# Patient Record
Sex: Male | Born: 1955 | Race: Black or African American | Hispanic: No | Marital: Married | Smoking: Former smoker
Health system: Southern US, Community
[De-identification: ages and names within clinical notes are randomized; demographics above are authoritative.]

## PROBLEM LIST (undated history)

## (undated) DIAGNOSIS — H409 Unspecified glaucoma: Secondary | ICD-10-CM

## (undated) HISTORY — DX: Unspecified glaucoma: H40.9

---

## 1998-02-15 ENCOUNTER — Ambulatory Visit (HOSPITAL_COMMUNITY): Admission: RE | Admit: 1998-02-15 | Discharge: 1998-02-15 | Payer: Self-pay | Admitting: Cardiology

## 1999-07-03 ENCOUNTER — Encounter: Payer: Self-pay | Admitting: Family Medicine

## 1999-07-03 ENCOUNTER — Ambulatory Visit (HOSPITAL_COMMUNITY): Admission: RE | Admit: 1999-07-03 | Discharge: 1999-07-03 | Payer: Self-pay | Admitting: Family Medicine

## 1999-08-05 ENCOUNTER — Encounter: Payer: Self-pay | Admitting: Orthopedic Surgery

## 1999-08-05 ENCOUNTER — Ambulatory Visit (HOSPITAL_COMMUNITY): Admission: RE | Admit: 1999-08-05 | Discharge: 1999-08-05 | Payer: Self-pay | Admitting: Orthopedic Surgery

## 1999-10-17 ENCOUNTER — Encounter: Payer: Self-pay | Admitting: Neurosurgery

## 1999-10-19 ENCOUNTER — Ambulatory Visit (HOSPITAL_COMMUNITY): Admission: RE | Admit: 1999-10-19 | Discharge: 1999-10-19 | Payer: Self-pay | Admitting: Neurosurgery

## 1999-10-19 ENCOUNTER — Encounter: Payer: Self-pay | Admitting: Neurosurgery

## 2002-06-12 HISTORY — PX: SPINE SURGERY: SHX786

## 2007-02-07 ENCOUNTER — Emergency Department (HOSPITAL_COMMUNITY): Admission: EM | Admit: 2007-02-07 | Discharge: 2007-02-07 | Payer: Self-pay | Admitting: Emergency Medicine

## 2007-03-28 ENCOUNTER — Ambulatory Visit (HOSPITAL_COMMUNITY): Admission: RE | Admit: 2007-03-28 | Discharge: 2007-03-29 | Payer: Self-pay | Admitting: Specialist

## 2008-08-04 IMAGING — CR DG PELVIS 1-2V
1 series · 1 of 1 positions shown · non-contrast
Comparison: none

CLINICAL DATA: 50-year-old male with back pain. 
 PELVIS ? 1 VIEW:

[t pelvis a.p.]
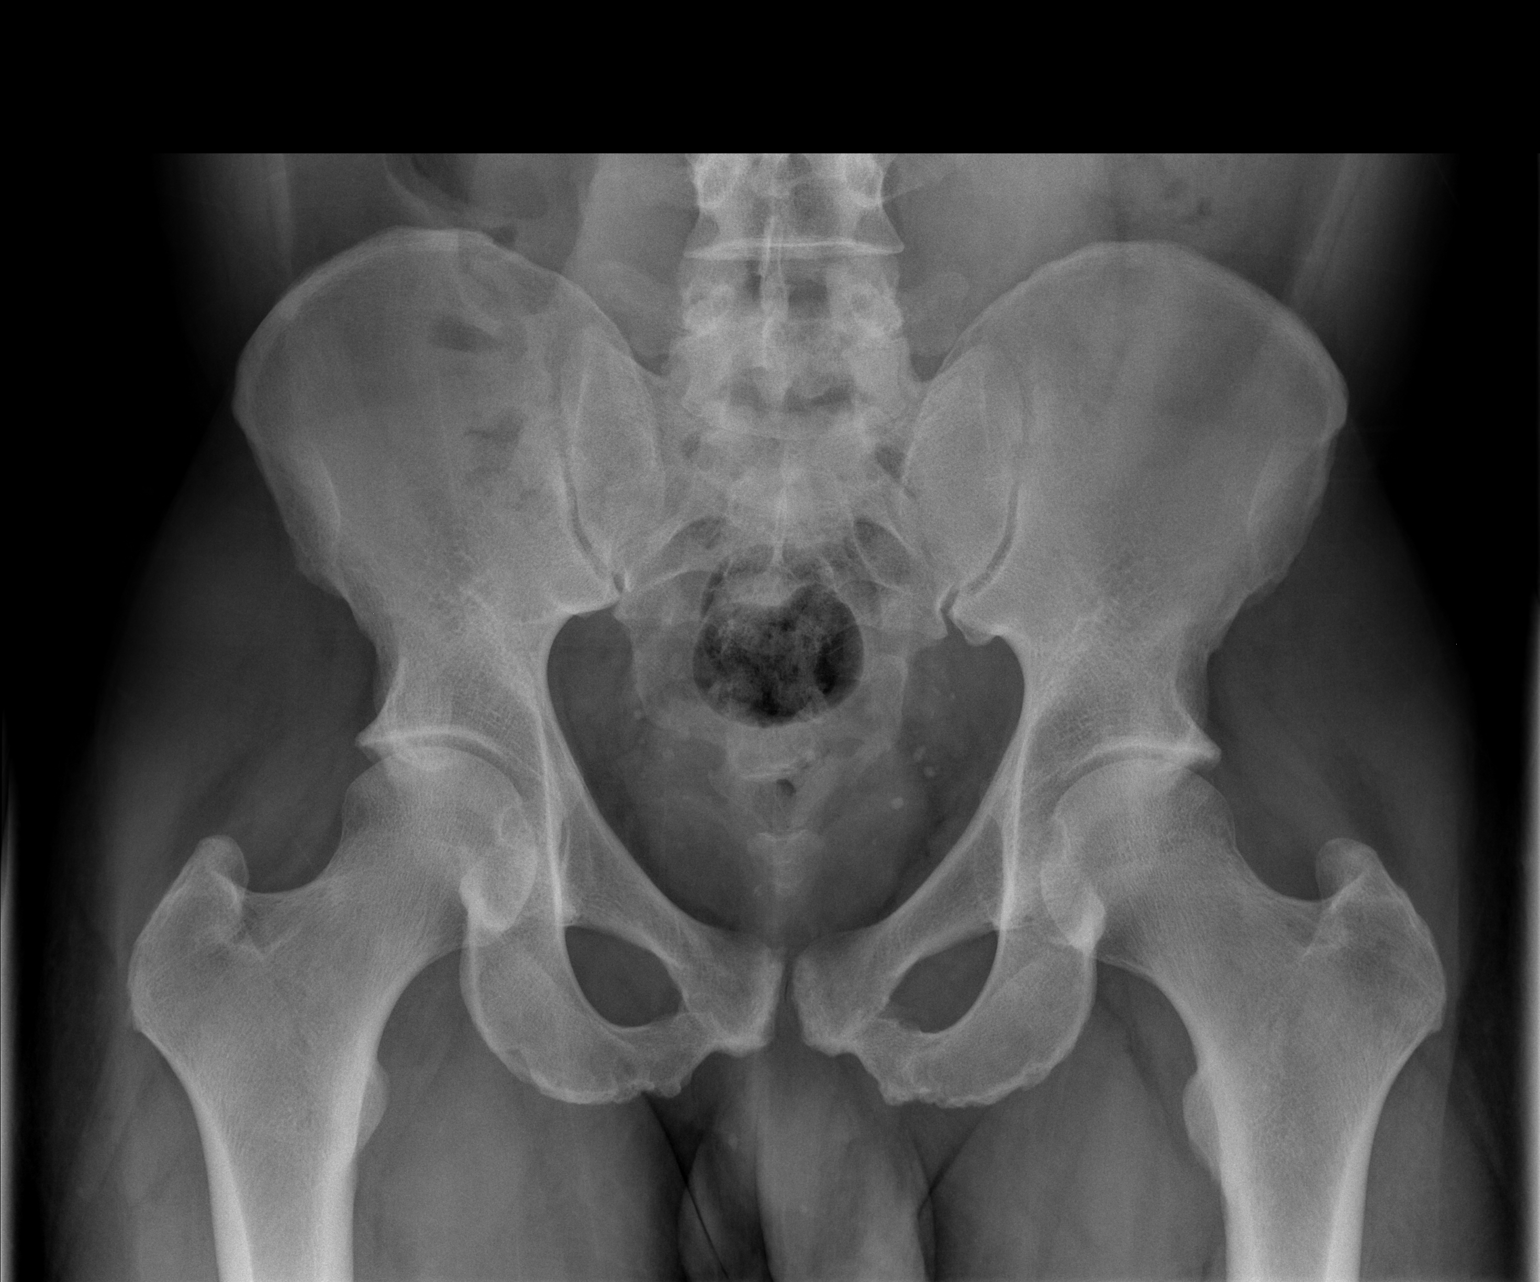

[1 of 1 positions shown; findings below may reference images not displayed]

FINDINGS: The femoral heads are projected over the acetabulum bilaterally.  There is no evidence for acute fracture.  Degenerative changes are seen along the ischial tuberosities.
IMPRESSION: 1.  Mild degenerative changes of ischial tuberosities.
 2.  No acute abnormality.

## 2008-12-10 ENCOUNTER — Encounter
Admission: RE | Admit: 2008-12-10 | Discharge: 2008-12-10 | Payer: Self-pay | Admitting: Physical Medicine and Rehabilitation

## 2010-10-25 NOTE — Op Note (Signed)
[name], [name]               ACCOUNT NO.:  [id number]   MEDICAL RECORD NO.:  [id number]          PATIENT TYPE:  OIB   LOCATION:  1528                         FACILITY:  [facility]   PHYSICIAN:  Jene Every, M.D.    DATE OF BIRTH:  1955/09/03   DATE OF PROCEDURE:  03/28/2007  DATE OF DISCHARGE:                               OPERATIVE REPORT   PREOPERATIVE DIAGNOSIS:  Spinal stenosis, recurrent disk herniation L5-  S1.   POSTOPERATIVE DIAGNOSIS:  Spinal stenosis, recurrent disk herniation L5-  S1.   PROCEDURE PERFORMED:  Redo decompression L5-S1 with a minimally invasive  microdiskectomy L5-S1, foraminotomy S1.   ANESTHESIA:  General.   ASSISTANT:  Roma Schanz, P.A.   BRIEF HISTORY AND INDICATIONS:  This a 55 year old male who sustained a  work-related injury, had a recurrent disk herniation L5-S1.  He had  positive neurotension signs, diminished plantar flexion, had failed  conservative treatment, had compression the S1 nerve root was indicated  for decompression.  Risks and benefits discussed including bleeding,  infection, damage to neurovascular structures, CSF leakage, epidural  fibrosis, adjacent segment disease and the need for fusion in future,  anesthetic complications etc.   TECHNIQUE:  The patient in supine position after induction of adequate  general anesthesia 2 grams of Kefzol was placed prone on the [place]  frame.  All bony prominences well-padded.  Lumbar region prepped draped  in the usual sterile fashion.  A 18 gauge spinal needles utilized to  localize the 5-1 interspace confirmed with x-ray.  Previous surgical  scar was excised.  Subcutaneous tissue was dissected.  Electrocautery  was utilized to achieve hemostasis.  Dorsal lumbar fascia identified  divided line of skin incision.  Paraspinous muscle elevated from lamina  of 5 and S1.  Operating microscope was draped brought in the surgical  field.  Some scar tissue was encountered.  A curette was  utilized detach  ligamentum flavum from the cephalad edge of S1.  Performed foraminotomy  of S1 with a 2 mm Kerrison.  Hemilaminotomy at the caudad edge of five  was performed, detaching ligamentum flavum.  Neural patty placed beneath  the ligamentum flavum and ligamentum flavum removed from the interspace.  Identified the nerve root which was compressing the lateral recess,  gently mobilized it medially.  There was extensive epidural venous  plexus tethering the S1 nerve root.  They were cauterized and divided.  We decompressed the lateral recess to the medial border of the pedicle.  The focal HNP was therefore noted.  Annulotomy was performed.  Copious  portion of the disk material was removed from the disk space with  straight and upbiting pituitaries, further mobilized with an Epstein  within the disk space and a hockey stick placed over the annulus,  depressing the fragment into the disk space and then retrieving it.  Large fragments were removed in multiple passes.  Following the full  diskectomy, there was at least a centimeter of excursion of the nerve  root medial to pedicle without difficulty.  No further disk herniation  was noted.  No CSF leakage or epidural fibrosis  was noted.  Disk space  and wound copiously irrigated with antibiotic irrigation.  The thrombin-  soaked Gelfoam was placed in laminotomy defect.  McCullough retractors  removed.  Paraspinous muscle inspected with no active bleeding.  Dorsolumbar fascia reapproximated with 0 Vicryl interrupted figure-of-  eight sutures.  Subcutaneous tissue reapproximated 2-0 Vicryl simple  sutures.  Skin was  reapproximated 4-0 subcuticular Prolene.  Wound reinforced Steri-Strips.  Sterile dressing applied.  Placed supine on hospital bed, extubated  without difficulty transported to recovery in satisfactory condition.   The patient tolerated the procedure well with no complications.      Jene Every, M.D.  Electronically  Signed     JB/MEDQ  D:  03/28/2007  T:  03/29/2007  Job:  [id number]

## 2010-10-28 NOTE — Op Note (Signed)
[facility]  Patient:    [name], [name]                      MRN: [id number] Proc. Date: 10/19/99 Adm. Date:  [id number] Disc. Date: [id number] Attending:  Donalee Citrin P                           Operative Report  PREOPERATIVE DIAGNOSIS:  Ruptured lumbar disk, L5-S1, left.  POSTOPERATIVE DIAGNOSIS:  PROCEDURE:  Lumbar hemilaminectomy and microdiskectomy, L5-S1, with microscopic dissection.  SURGEON:  FIRST ASSISTANT:  Julio Sicks, M.D.  ANESTHESIA:  General endotracheal.  INTRAVENOUS FLUIDS:  100 cc.  ESTIMATED BLOOD LOSS:  Less than 50 cc.  DESCRIPTION OF PROCEDURE:  The patient was brought to the OR, induced under general anesthesia, and positioned prone on the [place] frame.  His lower back was prepped and draped in sterile fashion.  The sacrum was palpated.  The L5-S1 interspace was palpated and an incision was made with a 20 blade scalpel centered over the L5-S1 interspace.  The Bovie electrocautery was used to take down the subcutaneous tissue and continue with the subperiosteal dissection of the muscle off of the lamina of L5 and S1.  Intraoperative x-ray with a 4 Penfield placed underneath the lamina of L5 confirmed the location of the L5-S1 interspace.  Then 3 mm and 4mm Kerrison rongeurs were used to initiate the laminotomy at L5.  This was carried down to S1.  The ligament was identified and incised.  The epidural fat was noticed.  The dura was then appreciated and the medial facetectomy was proceeded with 3 mm and 4 mm Kerrison rongeurs.  Then a 4 Penfield was used to palpate the interspace.  A large bulge was noted to be tenting the axilla of the S1 nerve root.  At this time, the operating microscope was brought into the field and using microscopic dissection, the S1 nerve root on the left side was further identified and teased off of the L5-S1 interspace, at which time a rent was noted in the annulus and in the posterior  longitudinal ligament.  A disk fragment was noted to be herniated through this, compressing the axilla of the S1 nerve root on the left.  A pituitary was used to tease out a large fragment of disk out of this annular tear.  Then the 11 blade was brought in and further widened out the annulotomy.  Using pituitary rongeurs and an Epstein curet, the remainder of the disk was removed from this view.  The downgoing Epstein curet was used to tease off the medial aspect of the disk space to free up the compression that was noted.  Also medially, some disk fragment was teased up from underneath the ligament, both cephalad, as well as caudad.  At the end of the diskectomy, the S1 nerve root on the left and the thecal sac were noted to be completely decompressed and free of further disk fragments. The blunt nerve hook and the angled [facility] were used to palpate both the neural foramen at S1 on the left, as well as the thecal medially and cephalad caudally.  There was noted to be no other signs of compression.  The operating microscope was removed.  The wound was irrigated and meticulous hemostasis was maintained.  Gelfoam was overlaid on the dural of the thecal sac in the proximal aspect of the S1 nerve root.  Then the  fascia was closed with 0 interrupted Vicryl.  The subcutaneous tissues were closed with 2-0 interrupted Vicryl.  The skin was closed with a 4-0 running subcuticular. Steri-Strips were applied.  The wound was dressed.  The patient went to the recovery room in stable condition.  At the end of the case, all needle counts and sponge counts were correct. DD:  10/19/99 TD:  10/20/99 Job: [id number]

## 2011-03-23 LAB — URINALYSIS, ROUTINE W REFLEX MICROSCOPIC
Nitrite: NEGATIVE
Specific Gravity, Urine: 1.033 — ABNORMAL HIGH
Urobilinogen, UA: 1
pH: 5.5

## 2011-03-23 LAB — CBC
HCT: 40.8
Hemoglobin: 14
MCHC: 34.3
MCV: 87.1
Platelets: 238
RBC: 4.69
WBC: 7.9

## 2011-03-23 LAB — BASIC METABOLIC PANEL
BUN: 12
Calcium: 9.5
GFR calc Af Amer: >60
Glucose, Bld: 137 — ABNORMAL HIGH

## 2013-06-29 ENCOUNTER — Ambulatory Visit: Payer: Self-pay | Admitting: Family Medicine

## 2013-06-29 VITALS — BP 140/88 | HR 68 | Temp 98.3°F | Resp 16 | Ht 70.5 in | Wt 211.2 lb

## 2013-06-29 DIAGNOSIS — Z0289 Encounter for other administrative examinations: Secondary | ICD-10-CM

## 2013-06-29 DIAGNOSIS — R030 Elevated blood-pressure reading, without diagnosis of hypertension: Secondary | ICD-10-CM

## 2013-06-29 NOTE — Progress Notes (Signed)
Subjective:    Patient ID: [name], male    DOB: [birth year], 58 y.o.   MRN: [id number]  HPI This chart was scribed for Nilda SimmerKristi Smith, MD by Andrew Auaven Small, ED Scribe. This patient was seen in room 13 and the patient's care was started at 4:52 PM.  HPI Comments: [name] is a 58 y.o. male who presents to the [facility] for a DOT physical. [facility]; only received a one year card due to elevated blood pressure; no history of HTN.  Pt reports he does not have a regular doctor. He has not recently had his BP checked but he does check it at grocery stores in which he states he is borderline HTN. Pt states that his BP was the same last year and that he adds salt to his foods.   Pt states that he has had glaucoma for about 6-7 years and that he takes eye drops for. Pt states he sees Dr. Mitzi DavenportBrewington about once a year. Pt states that he has had 2 surgeries for a herniated disc. He reports his first surgery in the late 90's early 00's and his second around 2004.   Pt denies MI, strokes, diabetes, or seizure. He denies chronic back pain but does have tingling and numbness in his toes once in a while, which he expects for his age. He denies difficulty urinating, dizziness, HA, blurred vision, sores in mouth, cp, sob, cough, shoulder pain,  neck pain,  leg swelling, diarrhea, constipation.  He reports his mom passed about 20 years ago in her late 1870's from lung cancer. She was a heavy smoker. His father died but cannot recall from what. He has 4 sisters, 3 living. One had massive MI in her early-mid 3260. His oldest sister is 6980 but does not have her full vision. He has 4 brothers. He states his oldest brother passed from leukemia; died at 5170. One of his brothers is on a lot of medication but cannot recall if he had a MI or stroke.   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 He is not a smoke and has not smoked in 20 years. Pt does not  consume alcohol and has not in 30 year. He reports no drugs or DWI's.    Past Medical History  Diagnosis Date  . Glaucoma     [facility] [place]; followed every twelve months.   No Known Allergies Prior to Admission medications   Medication Sig Start Date End Date Taking? Authorizing Provider  latanoprost (XALATAN) 0.005 % ophthalmic solution 1 drop at bedtime.   Yes Historical Provider, MD   Past Surgical History  Procedure Laterality Date  . Spine surgery  06/12/2002    Lumbar surgeries x 2 for herniated discs.   History   Social History  . Marital Status: Married    Spouse Name: N/A    Number of Children: N/A  . Years of Education: N/A   Occupational History  . Not on file.   Social History Main Topics  . Smoking status: Former Smoker    Quit date: 06/29/1993  . Smokeless tobacco: Not on file  . Alcohol Use: No  . Drug Use: Not on file  . Sexual Activity: Not on file   Other Topics Concern  . Not on file   Social History Narrative   Marital status:  married     Children:  2  children      Employment:  Therapist, music; truck Environmental education officer; temporary service.      Tobacco: quit in 27 years ago.      Alcohol: none; quit 37 years; no DWIs      Drugs:  none   Family History  Problem Relation Age of Onset  . Cancer Mother     lung cancer  . Diabetes Father   . Heart disease Sister      Review of Systems  Constitutional: Negative for fever, chills, diaphoresis, activity change, appetite change and fatigue.  HENT: Negative for ear pain, hearing loss, rhinorrhea and tinnitus.   Eyes: Negative for visual disturbance.  Respiratory: Negative for chest tightness, shortness of breath and wheezing.   Cardiovascular: Negative for chest pain and leg swelling.  Gastrointestinal: Negative for nausea, vomiting, abdominal pain, diarrhea, constipation and blood in stool.  Genitourinary: Negative for dysuria, urgency, frequency, flank pain, decreased urine volume, penile  swelling and difficulty urinating.  Musculoskeletal: Negative for arthralgias, back pain, gait problem, joint swelling, myalgias, neck pain and neck stiffness.  Skin: Negative for rash.  Neurological: Positive for numbness. Negative for dizziness, tremors, seizures, syncope, speech difficulty, weakness, light-headedness and headaches.  Psychiatric/Behavioral: Negative for sleep disturbance and dysphoric mood. The patient is not nervous/anxious.        Objective:   Physical Exam  Nursing note and vitals reviewed. Constitutional: He is oriented to person, place, and time. He appears well-developed and well-nourished. No distress.  HENT:  Head: Normocephalic and atraumatic.  Right Ear: External ear normal.  Left Ear: External ear normal.  Nose: Nose normal.  Mouth/Throat: Oropharynx is clear and moist.  Eyes: Conjunctivae and EOM are normal. Pupils are equal, round, and reactive to light.  Neck: Normal range of motion. Neck supple. Carotid bruit is not present. No tracheal deviation present.  Cardiovascular: Normal rate, regular rhythm, normal heart sounds and intact distal pulses.  Exam reveals no gallop and no friction rub.   No murmur heard. 150/84 medium adult cuff 140/88 large adult cuff  Pulmonary/Chest: Effort normal and breath sounds normal. No respiratory distress. He has no wheezes. He has no rales.  Abdominal: Soft. Bowel sounds are normal. He exhibits no distension and no mass. There is no tenderness. There is no rebound and no guarding. Hernia confirmed negative in the right inguinal area and confirmed negative in the left inguinal area.  Genitourinary: Testes normal and penis normal. Circumcised.  Musculoskeletal: Normal range of motion.       Right shoulder: Normal.       Left shoulder: Normal.       Right elbow: Normal.      Left elbow: Normal.       Right wrist: Normal.       Left wrist: Normal.       Right knee: Normal.       Left knee: Normal.       Right ankle:  Normal.       Left ankle: Normal.       Cervical back: Normal.       Lumbar back: Normal.       Right hand: Normal.       Left hand: Normal.       Right foot: Normal.       Left foot: Normal.  Lymphadenopathy:    He has no cervical adenopathy.  Neurological: He is alert and oriented to person, place, and time. He has normal reflexes. No cranial nerve deficit. He  exhibits normal muscle tone. Coordination normal.  Skin: Skin is warm and dry. No rash noted. He is not diaphoretic.  Psychiatric: He has a normal mood and affect. His behavior is normal. Judgment and thought content normal.        Assessment & Plan:   1. Health examination of defined subpopulation   2. Blood pressure elevated without history of HTN    1.  DOT CPE:  One year card due to persistently elevated/borderline BP.  Recommend weight loss, exercise, low-sodium food intake.   2.  Blood pressure elevated without dx:  Persistent; one year card; recommend weight loss, exercise, low-sodium food intake.  I personally performed the services described in this documentation, which was scribed in my presence.  The recorded information has been reviewed and is accurate.  Nilda Simmer, M.D.  [facility] [address] [place], [place]  [address] [phone] phone [phone] fax

## 2013-06-30 DIAGNOSIS — R030 Elevated blood-pressure reading, without diagnosis of hypertension: Secondary | ICD-10-CM | POA: Insufficient documentation

## 2014-06-27 ENCOUNTER — Ambulatory Visit (INDEPENDENT_AMBULATORY_CARE_PROVIDER_SITE_OTHER): Payer: Self-pay | Admitting: Family Medicine

## 2014-06-27 VITALS — BP 164/98 | HR 96 | Temp 98.5°F | Resp 19 | Ht 70.0 in | Wt 223.0 lb

## 2014-06-27 DIAGNOSIS — Z0000 Encounter for general adult medical examination without abnormal findings: Secondary | ICD-10-CM

## 2014-06-27 NOTE — Progress Notes (Signed)
° °  Subjective:    Patient ID: [name], male    DOB: Jan 22, 1956, 59 y.o.   MRN: [id number] This chart was scribed for Elvina SidleKurt Lauenstein, MD by Jolene Provostobert Halas, Medical Scribe. This patient was seen in Room 4 and the patient's care was started a 12:27 PM.  Chief Complaint  Patient presents with   Annual Exam    DOT PHYSICAL     HPI HPI Comments: [name] is a 59 y.o. male who [facility] to [facility] for a DOT physical. Pt states he takes Xalatan for glaucoma. Pt does not smoke. Pt does not drink. Pt states he is well.     Review of Systems  Constitutional: Negative for fever and chills.  Respiratory: Negative for shortness of breath.   Cardiovascular: Negative for chest pain.  Gastrointestinal: Negative for abdominal pain.       Objective:   Physical Exam  Constitutional: He is oriented to person, place, and time. He appears well-developed and well-nourished.  HENT:  Head: Normocephalic and atraumatic.  Eyes: Pupils are equal, round, and reactive to light.  Increased cup to disk ratio bilaterally.  Neck: Neck supple.  Cardiovascular: Normal rate, regular rhythm and normal heart sounds.   No murmur heard. Pulmonary/Chest: Effort normal and breath sounds normal. No respiratory distress.  Abdominal: Soft. There is no tenderness.  Genitourinary:  No hernia.  Musculoskeletal: Normal range of motion.  Neurological: He is alert and oriented to person, place, and time.  Skin: Skin is warm and dry.  Psychiatric: He has a normal mood and affect. His behavior is normal.  Nursing note and vitals reviewed.   BP 138/86 measured by Dr. Milus GlazierLauenstein.     Assessment & Plan:    This chart was scribed in my presence and reviewed by me personally.    ICD-9-CM ICD-10-CM   1. Annual physical exam V70.0 Z00.00      Signed, Elvina SidleKurt Lauenstein, MD

## 2015-09-25 ENCOUNTER — Encounter (HOSPITAL_COMMUNITY): Payer: Self-pay | Admitting: Emergency Medicine

## 2015-09-25 ENCOUNTER — Emergency Department (HOSPITAL_COMMUNITY)
Admission: EM | Admit: 2015-09-25 | Discharge: 2015-09-25 | Disposition: A | Discharge status: Home / Self Care | Payer: BLUE CROSS/BLUE SHIELD | Attending: Emergency Medicine | Admitting: Emergency Medicine

## 2015-09-25 DIAGNOSIS — J029 Acute pharyngitis, unspecified: Secondary | ICD-10-CM | POA: Diagnosis not present

## 2015-09-25 DIAGNOSIS — Z87891 Personal history of nicotine dependence: Secondary | ICD-10-CM | POA: Diagnosis not present

## 2015-09-25 DIAGNOSIS — R05 Cough: Secondary | ICD-10-CM | POA: Diagnosis not present

## 2015-09-25 DIAGNOSIS — R6889 Other general symptoms and signs: Secondary | ICD-10-CM

## 2015-09-25 DIAGNOSIS — R0981 Nasal congestion: Secondary | ICD-10-CM | POA: Insufficient documentation

## 2015-09-25 DIAGNOSIS — Z79899 Other long term (current) drug therapy: Secondary | ICD-10-CM | POA: Diagnosis not present

## 2015-09-25 DIAGNOSIS — R509 Fever, unspecified: Secondary | ICD-10-CM | POA: Diagnosis not present

## 2015-09-25 DIAGNOSIS — H409 Unspecified glaucoma: Secondary | ICD-10-CM | POA: Diagnosis not present

## 2015-09-25 MED ORDER — HYDROCODONE-HOMATROPINE 5-1.5 MG/5ML PO SYRP: 5.0000 mL | ORAL_SOLUTION | Freq: Four times a day (QID) | ORAL | Status: DC | PRN

## 2015-09-25 MED ORDER — ACETAMINOPHEN 325 MG PO TABS
ORAL_TABLET | ORAL | Status: AC
  Filled 2015-09-25: qty 2

## 2015-09-25 MED ORDER — ACETAMINOPHEN 325 MG PO TABS
650.0000 mg | ORAL_TABLET | Freq: Once | ORAL | Status: AC | PRN
  Administered 2015-09-25: 650 mg via ORAL

## 2015-09-25 NOTE — ED Notes (Signed)
Pt states he has had a cold since Monday and now his throat is sore. States it hurts when he swallows and coughs. Pt states he has had some night sweats as well.

## 2015-09-25 NOTE — ED Provider Notes (Signed)
CSN: [id number]     Arrival date & time 09/25/15  1648 History   First MD Initiated Contact with Patient 09/25/15 1801     Chief Complaint  Patient presents with  . Nasal Congestion  . Sore Throat     (Consider location/radiation/quality/duration/timing/severity/associated sxs/prior Treatment) HPI Comments: Patient with no pertinent past medical history presents to the emergency department with chief complaint of cough and cold symptoms. He states that he has had cough, sore throat, body aches, fevers, and chills for the past 5 days. He also reports having some night sweats. He has not tried taking anything for his symptoms. He did not get a flu shot. There are no modifying factors.   The history is provided by the patient. No language interpreter was used.    Past Medical History  Diagnosis Date  . Glaucoma     [facility] [place]; followed every twelve months.   Past Surgical History  Procedure Laterality Date  . Spine surgery  06/12/2002    Lumbar surgeries x 2 for herniated discs.   Family History  Problem Relation Age of Onset  . Cancer Mother     lung cancer  . Diabetes Father   . Heart disease Sister    Social History  Substance Use Topics  . Smoking status: Former Smoker    Quit date: 06/29/1993  . Smokeless tobacco: Never Used  . Alcohol Use: No    Review of Systems  Constitutional: Positive for fever and chills.  HENT: Positive for sore throat.   Respiratory: Positive for cough. Negative for shortness of breath.   Cardiovascular: Negative for chest pain.  Gastrointestinal: Negative for nausea, vomiting, diarrhea and constipation.  Genitourinary: Negative for dysuria.  All other systems reviewed and are negative.     Allergies  Review of patient's allergies indicates no known allergies.  Home Medications   Prior to Admission medications   Medication Sig Start Date End Date Taking? Authorizing Provider  latanoprost (XALATAN) 0.005 % ophthalmic  solution Place 1 drop into both eyes at bedtime.    Yes Historical Provider, MD   BP 145/88 mmHg  Pulse 89  Temp(Src) 100.2 F (37.9 C) (Oral)  Resp 18  Ht 5\' 11"  (1.803 m)  Wt 96.752 kg  BMI 29.76 kg/m2  SpO2 96% Physical Exam  Constitutional: He is oriented to person, place, and time. He appears well-developed and well-nourished. No distress.  HENT:  Head: Normocephalic and atraumatic.  Right Ear: External ear normal.  Left Ear: External ear normal.  Mildly erythematous, no tonsillar exudate, no abscess, no stridor, uvula is midline  TMs clear bilaterally  Eyes: Conjunctivae and EOM are normal. Pupils are equal, round, and reactive to light. Right eye exhibits no discharge. Left eye exhibits no discharge. No scleral icterus.  Neck: Normal range of motion. Neck supple. No JVD present.  Cardiovascular: Normal rate, regular rhythm and normal heart sounds.  Exam reveals no gallop and no friction rub.   No murmur heard. Pulmonary/Chest: Effort normal and breath sounds normal. No stridor. No respiratory distress. He has no wheezes. He has no rales. He exhibits no tenderness.  CTAB  Abdominal: Soft. Bowel sounds are normal. He exhibits no distension and no mass. There is no tenderness. There is no rebound and no guarding.  Musculoskeletal: Normal range of motion. He exhibits no edema or tenderness.  Neurological: He is alert and oriented to person, place, and time.  Skin: Skin is warm and dry. No rash noted. He is not  diaphoretic.  Psychiatric: He has a normal mood and affect. His behavior is normal. Judgment and thought content normal.  Nursing note and vitals reviewed.   ED Course  Procedures (including critical care time)   MDM   Final diagnoses:  Flu-like symptoms    Patient with symptoms consistent with influenza.  Vitals are stable, low-grade fever.  No signs of dehydration, tolerating PO's.  Lungs are clear. Due to patient's presentation and physical exam a chest x-ray  was not ordered bc likely diagnosis of flu. Patient will be discharged with instructions to orally hydrate, rest, and use over-the-counter medications such as anti-inflammatories ibuprofen and Aleve for muscle aches and Tylenol for fever.  Patient will also be given a cough suppressant.      Roxy Horseman, PA-C 09/25/15 1849  Mancel Bale, MD 09/25/15 [phone]

## 2015-09-25 NOTE — Discharge Instructions (Signed)

## 2016-06-26 ENCOUNTER — Ambulatory Visit (INDEPENDENT_AMBULATORY_CARE_PROVIDER_SITE_OTHER): Payer: Self-pay | Admitting: Family Medicine

## 2016-06-26 VITALS — BP 162/94 | HR 91 | Temp 98.5°F | Ht 71.0 in | Wt 209.0 lb

## 2016-06-26 DIAGNOSIS — Z024 Encounter for examination for driving license: Secondary | ICD-10-CM

## 2016-06-26 DIAGNOSIS — R030 Elevated blood-pressure reading, without diagnosis of hypertension: Secondary | ICD-10-CM

## 2016-06-26 NOTE — Patient Instructions (Addendum)
You were given a 1 year card today due to elevated blood pressure. Be seen by a primary care provider to discuss blood pressure and medication. For future evaluation, blood pressure must be below 140/90 for certification.   IF you received an x-ray today, you will receive an invoice from [facility]. Please contact [facility] at [phone] with questions or concerns regarding your invoice.   IF you received labwork today, you will receive an invoice from [facility]. Please contact [facility] at [phone] with questions or concerns regarding your invoice.   Our billing staff will not be able to assist you with questions regarding bills from these companies.  You will be contacted with the lab results as soon as they are available. The fastest way to get your results is to activate your My Chart account. Instructions are located on the last page of this paperwork. If you have not heard from us regarding the results in 2 weeks, please contact this office.

## 2016-06-26 NOTE — Progress Notes (Signed)
Subjective:  This chart was scribed for Meredith Staggers MD, by Veverly Fells, at [facility].  This patient was seen in room 9 and the patient's care was started at 12:56 PM.    Chief Complaint  Patient presents with  . Annual Exam    DOT     Patient ID: [name], male    DOB: 1955-08-31, 61 y.o.   MRN: [id number]  HPI HPI Comments: [name] is a 61 y.o. male who presents to the [facility] for a DOT physical exam. He was last seen by Dr. Milus Glazier in January 2016, initial BP was elevated but normal on repeat testing. Last DOT physical was January 2015 by Dr. Katrinka Blazing.  History of glaucoma, on drops, followed by opthalmology at that time. He was given a 1 year card due to persistently elevated or borderline blood pressure at visit in 2015. --- Patient does not use any blood pressure medication.  He does not smoke or drink alcohol.  Patient does not have a regular PCP.  He received a 2 year card in 2016 when he had his blood pressure down to 138/86.  Patient measures his blood pressure occasionally and usually gets numbers around 140/90.  Denies history of heart attack/stroke, difficulty breathing or difficulty with exercise.  He is currently seeing Dr. Melene Muller for his glaucoma (last visit was last week).  He denies any loss of vision or difficulty seeing at night.  He denies any difficulty using his arms or legs.  Patient states that his wife tells him he snores.  He denies any day time somnolence.   He had back surgery on a disc (2 times) about 8-10 years ago.  He denies any back pain or difficulty getting in/out of the car or squatting.  Patient is able to lift and move with ease.    Wt Readings from Last 3 Encounters:  06/26/16 209 lb (94.8 kg)  09/25/15 213 lb 4.8 oz (96.8 kg)  06/27/14 223 lb (101.2 kg)        Patient Active Problem List   Diagnosis Date Noted  . Blood pressure elevated without history of HTN 06/30/2013    Past Medical History:  Diagnosis Date  . Glaucoma    [facility] in [place]; followed every twelve months.   Past Surgical History:  Procedure Laterality Date  . SPINE SURGERY  06/12/2002   Lumbar surgeries x 2 for herniated discs.   No Known Allergies Prior to Admission medications   Medication Sig Start Date End Date Taking? Authorizing Provider  latanoprost (XALATAN) 0.005 % ophthalmic solution Place 1 drop into both eyes at bedtime.    Yes Historical Provider, MD  HYDROcodone-homatropine (HYCODAN) 5-1.5 MG/5ML syrup Take 5 mLs by mouth every 6 (six) hours as needed for cough. Patient not taking: Reported on 06/26/2016 09/25/15   Roxy Horseman, PA-C   Social History   Social History  . Marital status: Married    Spouse name: N/A  . Number of children: N/A  . Years of education: N/A   Occupational History  . Not on file.   Social History Main Topics  . Smoking status: Former Smoker    Quit date: 06/29/1993  . Smokeless tobacco: Never Used  . Alcohol use No  . Drug use: No  . Sexual activity: Not on file   Other Topics Concern  . Not on file   Social History Narrative   Marital status:  married  Children:  2 children      Employment:  Therapist, music; truck Environmental education officer; temporary service.      Tobacco: quit in 27 years ago.      Alcohol: none; quit 37 years; no DWIs      Drugs:  none      Review of Systems  All other systems reviewed and are negative.      Objective:   Physical Exam  Constitutional: He is oriented to person, place, and time. He appears well-developed and well-nourished.  HENT:  Head: Normocephalic and atraumatic.  Right Ear: External ear normal.  Left Ear: External ear normal.  Mouth/Throat: Oropharynx is clear and moist.  Eyes: Conjunctivae and EOM are normal. Pupils are equal, round, and reactive to light.  Neck: Normal range of motion. Neck supple. No thyromegaly present.  Cardiovascular: Normal rate, regular rhythm, normal  heart sounds and intact distal pulses.   Pulmonary/Chest: Effort normal and breath sounds normal. No respiratory distress. He has no wheezes.  Abdominal: Soft. He exhibits no distension. There is no tenderness. Hernia confirmed negative in the right inguinal area and confirmed negative in the left inguinal area.  Musculoskeletal: Normal range of motion. He exhibits no edema or tenderness.  Lymphadenopathy:    He has no cervical adenopathy.  Neurological: He is alert and oriented to person, place, and time. He has normal reflexes.  Skin: Skin is warm and dry.  Psychiatric: He has a normal mood and affect. His behavior is normal.  Vitals reviewed.  Vitals:   06/26/16 1149  BP: (!) 150/94  Pulse: 91  Temp: 98.5 F (36.9 C)  TempSrc: Oral  SpO2: 95%  Weight: 209 lb (94.8 kg)  Height: 5\' 11"  (1.803 m)        Assessment & Plan:  [name] is a 61 y.o. male Encounter for commercial driver medical examination (CDME)  Elevated blood pressure reading without diagnosis of hypertension One year card due to elevated blood pressure. Advised to follow-up with primary care provider. Admitted to snoring but no daytime somnolence, advised if any daytime somnolence or any increased snoring especially with any pauses, should not drive until he has been evaluated for obstructive sleep apnea. Currently without symptoms. History of glaucoma, managed with medication. Visual acuity ok - see paperwork.   No orders of the defined types were placed in this encounter.  Patient Instructions   You were given a 1 year card today due to elevated blood pressure. Be seen by a primary care provider to discuss blood pressure and medication. For future evaluation, blood pressure must be below 140/90 for certification.   IF you received an x-ray today, you will receive an invoice from [facility]. Please contact [facility] at [phone] with questions or concerns regarding your invoice.     IF you received labwork today, you will receive an invoice from [facility]. Please contact [facility] at [phone] with questions or concerns regarding your invoice.   Our billing staff will not be able to assist you with questions regarding bills from these companies.  You will be contacted with the lab results as soon as they are available. The fastest way to get your results is to activate your My Chart account. Instructions are located on the last page of this paperwork. If you have not heard from [place] regarding the results in 2 weeks, please contact this office.       I personally performed the services described in this documentation, which was scribed in my  presence. The recorded information has been reviewed and considered, and addended by me as needed.   Signed,   Meredith StaggersJeffrey Dayra Rapley, MD Primary Care at [facility].  06/26/16 1:12 PM

## 2017-01-03 DIAGNOSIS — H402234 Chronic angle-closure glaucoma, bilateral, indeterminate stage: Secondary | ICD-10-CM | POA: Diagnosis not present

## 2017-06-27 ENCOUNTER — Other Ambulatory Visit: Payer: Self-pay

## 2017-06-27 ENCOUNTER — Ambulatory Visit: Payer: Self-pay | Admitting: Physician Assistant

## 2017-06-27 ENCOUNTER — Encounter: Payer: Self-pay | Admitting: Physician Assistant

## 2017-06-27 VITALS — BP 138/88 | HR 73 | Temp 98.0°F | Resp 16 | Ht 72.05 in | Wt 209.0 lb

## 2017-06-27 DIAGNOSIS — Z0289 Encounter for other administrative examinations: Secondary | ICD-10-CM

## 2017-06-27 NOTE — Patient Instructions (Addendum)
Please follow up with us or anywhere as soon as possible of this glucose spilling into your urine.  We should make sure that this is not diabetes, which it is likely definite.  This can affect your heart, kidneys, eyes, and neurovascular.  I can definitely see you on this.   If you decide to go somewhere else, find a primary care.     IF you received an x-ray today, you will receive an invoice from [facility]. Please contact [facility] at [phone] with questions or concerns regarding your invoice.   IF you received labwork today, you will receive an invoice from [facility]. Please contact [facility] at [phone] with questions or concerns regarding your invoice.   Our billing staff will not be able to assist you with questions regarding bills from these companies.  You will be contacted with the lab results as soon as they are available. The fastest way to get your results is to activate your My Chart account. Instructions are located on the last page of this paperwork. If you have not heard from us regarding the results in 2 weeks, please contact this office.

## 2017-06-27 NOTE — Progress Notes (Signed)
PRIMARY CARE AT [facility] [address], [place] [address] [phone]  Date:  06/27/2017   Name:  [name]   DOB:  Dec 20, 1955   MRN:  [id number]  PCP:  Patient, No Pcp Per    History of Present Illness:  [name] is a 62 y.o. male patient who presents to PCP with  Chief Complaint  Patient presents with  . DOT Physical     No complaints or concerns at this time.   Patient Active Problem List   Diagnosis Date Noted  . Blood pressure elevated without history of HTN 06/30/2013    Past Medical History:  Diagnosis Date  . Gl[place]    [facility] [place]; followed every twelve months.    Past Surgical History:  Procedure Laterality Date  . SPINE SURGERY  06/12/2002   Lumbar surgeries x 2 for herniated discs.    Social History   Tobacco Use  . Smoking status: Former Smoker    Last attempt to quit: 06/29/1993    Years since quitting: 24.0  . Smokeless tobacco: Never Used  Substance Use Topics  . Alcohol use: No    Alcohol/week: 0.0 oz  . Drug use: No    Family History  Problem Relation Age of Onset  . Cancer Mother        lung cancer  . Diabetes Father   . Heart disease Sister     No Known Allergies  Medication list has been reviewed and updated.  Current Outpatient Medications on File Prior to Visit  Medication Sig Dispense Refill  . latanoprost (XALATAN) 0.005 % ophthalmic solution Place 1 drop into both eyes at bedtime.      No current facility-administered medications on file prior to visit.     Review of Systems  Constitutional: Negative for chills and fever.  HENT: Negative for ear discharge, ear pain and sore throat.   Eyes: Negative for blurred vision and double vision.  Respiratory: Negative for cough, shortness of breath and wheezing.   Cardiovascular: Negative for chest pain, palpitations and leg swelling.  Gastrointestinal: Negative for diarrhea, nausea and vomiting.  Genitourinary: Negative for dysuria, frequency and  hematuria.  Skin: Negative for itching and rash.  Neurological: Negative for dizziness and headaches.   ROS otherwise unremarkable unless listed above.  Physical Examination: BP 138/88   Pulse 73   Temp 98 F (36.7 C) (Oral)   Resp 16   Ht 6' 0.05" (1.83 m)   Wt 209 lb (94.8 kg)   SpO2 97%   BMI 28.31 kg/m  Ideal Body Weight: Weight in (lb) to have BMI = 25: 184.2  Physical Exam  Constitutional: He is oriented to person, place, and time. He appears well-developed and well-nourished. No distress.  HENT:  Head: Normocephalic and atraumatic.  Right Ear: Tympanic membrane, external ear and ear canal normal.  Left Ear: Tympanic membrane, external ear and ear canal normal.  Eyes: Conjunctivae and EOM are normal. Pupils are equal, round, and reactive to light.  Cardiovascular: Normal rate and regular rhythm. Exam reveals no friction rub.  No murmur heard. Pulmonary/Chest: Effort normal. No respiratory distress. He has no wheezes.  Abdominal: Soft. Bowel sounds are normal. He exhibits no distension and no mass. There is no tenderness.  Musculoskeletal: Normal range of motion. He exhibits no edema or tenderness.  Neurological: He is alert and oriented to person, place, and time. He displays normal reflexes.  Skin: Skin is warm and dry. He is not diaphoretic.  Psychiatric: He has a normal mood and affect. His behavior is normal.    Visual Acuity Screening   Right eye Left eye Both eyes  Without correction:     With correction: 20/20 20/20 20/13      Assessment and Plan: [name] is a 62 y.o. male who is here today for cc of  Chief Complaint  Patient presents with  . DOT Physical    He was advised to follow up here or at a pcp asap for glucose in urine.  He voiced understanding, and states that he will schedule for this Saturday. 2 year given today Encounter for examination required by Department of Transportation (DOT)  Trena Platt, PA-C [facility] 1/16/20191:50 PM

## 2017-06-30 ENCOUNTER — Telehealth: Payer: Self-pay | Admitting: Physician Assistant

## 2017-06-30 NOTE — Telephone Encounter (Signed)
**  TRIED TO CALL PT TO RESCHEDULE--NO VOICEMAIL SET UP COULD NOT LEAVE MESSAGE--PT NEEDS TO BE RESCHDULE TO A DIFFERENT DAY OR A DIFFERENT PROVIDER**CC °

## 2017-07-02 ENCOUNTER — Ambulatory Visit: Payer: Self-pay | Admitting: Physician Assistant

## 2017-07-04 ENCOUNTER — Ambulatory Visit: Payer: Self-pay | Admitting: Physician Assistant

## 2017-08-21 DIAGNOSIS — E1165 Type 2 diabetes mellitus with hyperglycemia: Secondary | ICD-10-CM | POA: Diagnosis not present

## 2017-08-21 DIAGNOSIS — Z125 Encounter for screening for malignant neoplasm of prostate: Secondary | ICD-10-CM | POA: Diagnosis not present

## 2017-08-21 DIAGNOSIS — N529 Male erectile dysfunction, unspecified: Secondary | ICD-10-CM | POA: Diagnosis not present

## 2017-08-21 DIAGNOSIS — Z13220 Encounter for screening for lipoid disorders: Secondary | ICD-10-CM | POA: Diagnosis not present

## 2017-08-21 DIAGNOSIS — Z0000 Encounter for general adult medical examination without abnormal findings: Secondary | ICD-10-CM | POA: Diagnosis not present

## 2017-08-21 DIAGNOSIS — L259 Unspecified contact dermatitis, unspecified cause: Secondary | ICD-10-CM | POA: Diagnosis not present

## 2017-08-22 DIAGNOSIS — H402213 Chronic angle-closure glaucoma, right eye, severe stage: Secondary | ICD-10-CM | POA: Diagnosis not present

## 2017-09-04 DIAGNOSIS — N529 Male erectile dysfunction, unspecified: Secondary | ICD-10-CM | POA: Diagnosis not present

## 2017-09-04 DIAGNOSIS — I10 Essential (primary) hypertension: Secondary | ICD-10-CM | POA: Diagnosis not present

## 2017-09-04 DIAGNOSIS — E1165 Type 2 diabetes mellitus with hyperglycemia: Secondary | ICD-10-CM | POA: Diagnosis not present

## 2017-09-10 ENCOUNTER — Encounter: Payer: Self-pay | Admitting: Physician Assistant

## 2017-09-19 DIAGNOSIS — H402213 Chronic angle-closure glaucoma, right eye, severe stage: Secondary | ICD-10-CM | POA: Diagnosis not present

## 2017-10-23 DIAGNOSIS — I10 Essential (primary) hypertension: Secondary | ICD-10-CM | POA: Diagnosis not present

## 2017-10-23 DIAGNOSIS — E1165 Type 2 diabetes mellitus with hyperglycemia: Secondary | ICD-10-CM | POA: Diagnosis not present

## 2017-12-04 DIAGNOSIS — I10 Essential (primary) hypertension: Secondary | ICD-10-CM | POA: Diagnosis not present

## 2017-12-04 DIAGNOSIS — E1165 Type 2 diabetes mellitus with hyperglycemia: Secondary | ICD-10-CM | POA: Diagnosis not present

## 2018-04-02 DIAGNOSIS — E1165 Type 2 diabetes mellitus with hyperglycemia: Secondary | ICD-10-CM | POA: Diagnosis not present

## 2018-04-02 DIAGNOSIS — I10 Essential (primary) hypertension: Secondary | ICD-10-CM | POA: Diagnosis not present

## 2018-04-02 DIAGNOSIS — Z23 Encounter for immunization: Secondary | ICD-10-CM | POA: Diagnosis not present

## 2018-08-06 DIAGNOSIS — E1165 Type 2 diabetes mellitus with hyperglycemia: Secondary | ICD-10-CM | POA: Diagnosis not present

## 2018-08-06 DIAGNOSIS — Z125 Encounter for screening for malignant neoplasm of prostate: Secondary | ICD-10-CM | POA: Diagnosis not present

## 2018-08-06 DIAGNOSIS — R200 Anesthesia of skin: Secondary | ICD-10-CM | POA: Diagnosis not present

## 2018-08-06 DIAGNOSIS — Z13220 Encounter for screening for lipoid disorders: Secondary | ICD-10-CM | POA: Diagnosis not present

## 2018-08-06 DIAGNOSIS — I10 Essential (primary) hypertension: Secondary | ICD-10-CM | POA: Diagnosis not present

## 2018-08-23 DIAGNOSIS — H402213 Chronic angle-closure glaucoma, right eye, severe stage: Secondary | ICD-10-CM | POA: Diagnosis not present

## 2018-09-03 DIAGNOSIS — M5126 Other intervertebral disc displacement, lumbar region: Secondary | ICD-10-CM | POA: Diagnosis not present

## 2018-09-03 DIAGNOSIS — R200 Anesthesia of skin: Secondary | ICD-10-CM | POA: Diagnosis not present

## 2018-09-03 DIAGNOSIS — I10 Essential (primary) hypertension: Secondary | ICD-10-CM | POA: Diagnosis not present

## 2018-09-03 DIAGNOSIS — E1165 Type 2 diabetes mellitus with hyperglycemia: Secondary | ICD-10-CM | POA: Diagnosis not present

## 2018-10-10 DIAGNOSIS — I10 Essential (primary) hypertension: Secondary | ICD-10-CM | POA: Diagnosis not present

## 2018-10-10 DIAGNOSIS — E1165 Type 2 diabetes mellitus with hyperglycemia: Secondary | ICD-10-CM | POA: Diagnosis not present

## 2019-05-21 DIAGNOSIS — H402213 Chronic angle-closure glaucoma, right eye, severe stage: Secondary | ICD-10-CM | POA: Diagnosis not present

## 2021-03-16 ENCOUNTER — Other Ambulatory Visit: Payer: Self-pay | Admitting: Internal Medicine

## 2021-03-17 LAB — COMPLETE METABOLIC PANEL WITH GFR
AG Ratio: 1.6 (calc) (ref 1.0–2.5)
ALT: 14 U/L (ref 9–46)
AST: 19 U/L (ref 10–35)
Albumin: 4.3 g/dL (ref 3.6–5.1)
Alkaline phosphatase (APISO): 51 U/L (ref 35–144)
BUN: 9 mg/dL (ref 7–25)
CO2: 25 mmol/L (ref 20–32)
Calcium: 9.3 mg/dL (ref 8.6–10.3)
Chloride: 99 mmol/L (ref 98–110)
Creat: 0.91 mg/dL (ref 0.70–1.35)
Globulin: 2.7 g/dL (calc) (ref 1.9–3.7)
Glucose, Bld: 151 mg/dL — ABNORMAL HIGH (ref 65–99)
Potassium: 3.9 mmol/L (ref 3.5–5.3)
Sodium: 137 mmol/L (ref 135–146)
Total Bilirubin: 0.7 mg/dL (ref 0.2–1.2)
Total Protein: 7 g/dL (ref 6.1–8.1)
eGFR: 94 mL/min/{1.73_m2} (ref >=60)

## 2021-03-17 LAB — PSA: PSA: 0.25 ng/mL (ref <=4.00)

## 2021-03-17 LAB — CBC
HCT: 40.2 % (ref 38.5–50.0)
Hemoglobin: 13.8 g/dL (ref 13.2–17.1)
MCH: 29.4 pg (ref 27.0–33.0)
MCHC: 34.3 g/dL (ref 32.0–36.0)
MCV: 85.7 fL (ref 80.0–100.0)
MPV: 10.5 fL (ref 7.5–12.5)
Platelets: 213 10*3/uL (ref 140–400)
RBC: 4.69 10*6/uL (ref 4.20–5.80)
RDW: 13 % (ref 11.0–15.0)
WBC: 7.7 10*3/uL (ref 3.8–10.8)

## 2021-03-17 LAB — LIPID PANEL
Cholesterol: 152 mg/dL (ref <200)
HDL: 33 mg/dL — ABNORMAL LOW (ref >=40)
LDL Cholesterol (Calc): 91 mg/dL (calc)
Non-HDL Cholesterol (Calc): 119 mg/dL (calc) (ref <130)
Total CHOL/HDL Ratio: 4.6 (calc) (ref <5.0)
Triglycerides: 181 mg/dL — ABNORMAL HIGH (ref <150)

## 2021-03-17 LAB — VITAMIN D 25 HYDROXY (VIT D DEFICIENCY, FRACTURES): Vit D, 25-Hydroxy: 23 ng/mL — ABNORMAL LOW (ref 30–100)

## 2021-03-17 LAB — TSH: TSH: 2.02 mIU/L (ref 0.40–4.50)

## 2022-08-12 ENCOUNTER — Other Ambulatory Visit: Payer: Self-pay

## 2022-08-12 ENCOUNTER — Emergency Department (HOSPITAL_COMMUNITY): Payer: Medicare Other

## 2022-08-12 ENCOUNTER — Emergency Department (HOSPITAL_COMMUNITY)
Admission: EM | Admit: 2022-08-12 | Discharge: 2022-08-12 | Disposition: A | Discharge status: Home / Self Care | Payer: Medicare Other | Attending: Emergency Medicine | Admitting: Emergency Medicine

## 2022-08-12 ENCOUNTER — Encounter (HOSPITAL_COMMUNITY): Payer: Self-pay | Admitting: *Deleted

## 2022-08-12 DIAGNOSIS — Y92410 Unspecified street and highway as the place of occurrence of the external cause: Secondary | ICD-10-CM | POA: Insufficient documentation

## 2022-08-12 DIAGNOSIS — S39012A Strain of muscle, fascia and tendon of lower back, initial encounter: Secondary | ICD-10-CM | POA: Diagnosis not present

## 2022-08-12 DIAGNOSIS — S3992XA Unspecified injury of lower back, initial encounter: Secondary | ICD-10-CM | POA: Diagnosis present

## 2022-08-12 MED ORDER — ACETAMINOPHEN 500 MG PO TABS
1000.0000 mg | ORAL_TABLET | Freq: Once | ORAL | Status: AC
  Administered 2022-08-12: 1000 mg via ORAL
  Filled 2022-08-12: qty 2
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
